# Patient Record
Sex: Female | Born: 1980 | Race: White | Hispanic: No | Marital: Single | Smoking: Former smoker
Health system: Southern US, Community
[De-identification: ages and names within clinical notes are randomized; demographics above are authoritative.]

## PROBLEM LIST (undated history)

## (undated) ENCOUNTER — Ambulatory Visit

## (undated) DIAGNOSIS — K900 Celiac disease: Secondary | ICD-10-CM

## (undated) HISTORY — DX: Celiac disease: K90.0

## (undated) HISTORY — PX: WISDOM TOOTH EXTRACTION: SHX21

---

## 2001-10-06 ENCOUNTER — Emergency Department (HOSPITAL_COMMUNITY): Admission: EM | Admit: 2001-10-06 | Discharge: 2001-10-06 | Payer: Self-pay | Admitting: Emergency Medicine

## 2003-07-23 ENCOUNTER — Other Ambulatory Visit: Admission: RE | Admit: 2003-07-23 | Discharge: 2003-07-23 | Payer: Self-pay | Admitting: Family Medicine

## 2003-09-04 ENCOUNTER — Encounter: Admission: RE | Admit: 2003-09-04 | Discharge: 2003-09-04 | Payer: Self-pay | Admitting: Family Medicine

## 2004-08-13 ENCOUNTER — Other Ambulatory Visit: Admission: RE | Admit: 2004-08-13 | Discharge: 2004-08-13 | Payer: Self-pay | Admitting: Family Medicine

## 2005-08-26 ENCOUNTER — Other Ambulatory Visit: Admission: RE | Admit: 2005-08-26 | Discharge: 2005-08-26 | Payer: Self-pay | Admitting: Family Medicine

## 2005-11-29 ENCOUNTER — Ambulatory Visit: Payer: Self-pay | Admitting: Family Medicine

## 2006-10-13 ENCOUNTER — Other Ambulatory Visit: Admission: RE | Admit: 2006-10-13 | Discharge: 2006-10-13 | Payer: Self-pay | Admitting: Family Medicine

## 2007-10-23 ENCOUNTER — Other Ambulatory Visit: Admission: RE | Admit: 2007-10-23 | Discharge: 2007-10-23 | Payer: Self-pay | Admitting: Family Medicine

## 2010-06-14 ENCOUNTER — Encounter: Payer: Self-pay | Admitting: Family Medicine

## 2011-03-08 ENCOUNTER — Other Ambulatory Visit: Payer: Self-pay | Admitting: Family Medicine

## 2011-03-08 ENCOUNTER — Other Ambulatory Visit (HOSPITAL_COMMUNITY)
Admission: RE | Admit: 2011-03-08 | Discharge: 2011-03-08 | Disposition: A | Discharge status: Home / Self Care | Payer: BC Managed Care – PPO | Source: Ambulatory Visit | Attending: Family Medicine | Admitting: Family Medicine

## 2011-03-08 DIAGNOSIS — Z124 Encounter for screening for malignant neoplasm of cervix: Secondary | ICD-10-CM | POA: Insufficient documentation

## 2011-03-08 DIAGNOSIS — Z1159 Encounter for screening for other viral diseases: Secondary | ICD-10-CM | POA: Insufficient documentation

## 2011-09-15 ENCOUNTER — Other Ambulatory Visit: Payer: Self-pay | Admitting: Dermatology

## 2013-09-12 ENCOUNTER — Other Ambulatory Visit: Payer: Self-pay | Admitting: Dermatology

## 2014-05-31 ENCOUNTER — Other Ambulatory Visit: Payer: Self-pay

## 2014-06-27 ENCOUNTER — Other Ambulatory Visit: Payer: Self-pay

## 2014-07-19 ENCOUNTER — Other Ambulatory Visit: Payer: Self-pay

## 2016-05-27 ENCOUNTER — Encounter: Payer: Self-pay | Admitting: Podiatry

## 2016-05-27 ENCOUNTER — Ambulatory Visit (INDEPENDENT_AMBULATORY_CARE_PROVIDER_SITE_OTHER): Payer: BLUE CROSS/BLUE SHIELD | Admitting: Podiatry

## 2016-05-27 VITALS — BP 119/81 | HR 88 | Resp 16 | Ht 65.0 in | Wt 130.0 lb

## 2016-05-27 DIAGNOSIS — B070 Plantar wart: Secondary | ICD-10-CM | POA: Diagnosis not present

## 2016-05-27 DIAGNOSIS — B351 Tinea unguium: Secondary | ICD-10-CM

## 2016-05-27 NOTE — Progress Notes (Signed)
Subjective:     Patient ID: Deanna Brooks, female   DOB: [birth year], 36 y.o.   MRN: [id number]  HPI patient presents stating that her right big toenail has become yellow and irritated and she damaged at 18 months ago. Also complains of a small lesion plantar aspect right hallux and does have history of wart   Review of Systems  All other systems reviewed and are negative.      Objective:   Physical Exam  Constitutional: She is oriented to person, place, and time.  Cardiovascular: Intact distal pulses.   Musculoskeletal: Normal range of motion.  Neurological: She is oriented to person, place, and time.  Skin: Skin is warm.  Nursing note and vitals reviewed.  neurovascular status was found to be intact muscle strength adequate range of motion within normal limits with patient found to have a thickened right hallux nail that's dystrophic damaged and mildly loose. There is also a keratotic lesion sub-right hallux measuring about 6 mm x 5 that upon debridement shows pinpoint bleeding pain to lateral pressure. Patient's found to have good digital perfusion well oriented 3     Assessment:     Damaged right hallux nail with possible mycotic and foot component and possible verruca plantar aspect right    Plan:     H&P and both conditions discussed. I do think I can work on both the same time and I infiltrated the right hallux 60 Milligan times like Marcaine mixture remove the nail cleaned up the bed and allowing new nail to regrow explaining it may or may not grow normally. I did go ahead and send that off to pathology to try to identify whether there is fungal element and for the plantar right hallux I did carefully curet out the lesion finding it to have a small epidermal dermal change consistent with verruca and I applied phenol to base and sterile dressing. Reappoint to recheck

## 2016-05-27 NOTE — Patient Instructions (Signed)

## 2016-06-24 ENCOUNTER — Encounter: Payer: Self-pay | Admitting: Podiatry

## 2016-06-24 ENCOUNTER — Ambulatory Visit (INDEPENDENT_AMBULATORY_CARE_PROVIDER_SITE_OTHER): Payer: BLUE CROSS/BLUE SHIELD | Admitting: Podiatry

## 2016-06-24 DIAGNOSIS — B351 Tinea unguium: Secondary | ICD-10-CM

## 2016-06-24 DIAGNOSIS — B070 Plantar wart: Secondary | ICD-10-CM | POA: Diagnosis not present

## 2016-06-24 NOTE — Progress Notes (Signed)
Subjective:     Patient ID: Deanna Brooks, female   DOB: [birth year], 36 y.o.   MRN: [id number]  HPI patient presents stating that the nail seems a little bit better and the plantar wart has really improved. Patient states she would like to have this fungus treated if possible and is trying to get pregnant at this time   Review of Systems     Objective:   Physical Exam Neurovascular status intact with positive fungal culture right big toe with crusted nailbeds secondary to removal    Assessment:     Mycotic nail infection right hallux localized with well-healing plantar verruca site    Plan:     Debrided plantar tissue discussed laser treatment and she will have 3 laser treatments done and start topical antifungal. Patient will be seen back to recheck

## 2016-06-28 ENCOUNTER — Ambulatory Visit: Payer: Self-pay | Admitting: Podiatry

## 2016-06-28 DIAGNOSIS — B351 Tinea unguium: Secondary | ICD-10-CM

## 2016-06-28 NOTE — Progress Notes (Signed)
Pt presents with mycotic infection of nails Rt 1st  All other systems are negative  Laser therapy administered to affected nails and tolerated well. All safety precautions were in place. Pt is to continue with essential oil topical and laser therapy. Follow up in 1 month for 2nd of 3 treatments

## 2016-07-26 ENCOUNTER — Ambulatory Visit: Payer: Self-pay | Admitting: Podiatry

## 2016-07-26 DIAGNOSIS — B351 Tinea unguium: Secondary | ICD-10-CM

## 2016-07-26 DIAGNOSIS — B070 Plantar wart: Secondary | ICD-10-CM

## 2016-07-26 NOTE — Progress Notes (Signed)
Subjective:     Patient ID: Deanna Brooks, female   DOB: 1980/07/26, 36 y.o.   MRN: [id number]  HPI patient presents stating that she wanted the lesion looked at and she's happy so far with laser   Review of Systems     Objective:   Physical Exam Neurovascular status intact with laser that seems to be doing well for nailbeds with patient having third treatment today and also small wart left which is improved with previous removal    Assessment:     H&P condition reviewed and at this time I debrided the lesion with no indication or reoccurrence of verruca.    Plan:     Laser applied and we may need to eventually applied more chemical to the left hallux depending on symptoms

## 2016-08-26 ENCOUNTER — Ambulatory Visit: Payer: BLUE CROSS/BLUE SHIELD

## 2016-08-31 DIAGNOSIS — B351 Tinea unguium: Secondary | ICD-10-CM

## 2016-09-01 ENCOUNTER — Ambulatory Visit (INDEPENDENT_AMBULATORY_CARE_PROVIDER_SITE_OTHER): Payer: Self-pay | Admitting: Podiatry

## 2016-09-01 DIAGNOSIS — B351 Tinea unguium: Secondary | ICD-10-CM

## 2016-09-03 NOTE — Progress Notes (Signed)
Pt presents with mycotic infection of nails Rt 1st  All other systems are negative  Laser therapy administered to affected nails and tolerated well. All safety precautions were in place. Pt is to continue with essential oil topical and laser therapy. Follow up as needed

## 2019-05-04 DIAGNOSIS — F4320 Adjustment disorder, unspecified: Secondary | ICD-10-CM | POA: Insufficient documentation

## 2019-12-28 ENCOUNTER — Ambulatory Visit
Admission: EM | Admit: 2019-12-28 | Discharge: 2019-12-28 | Disposition: A | Discharge status: Home / Self Care | Payer: Self-pay | Attending: Emergency Medicine | Admitting: Emergency Medicine

## 2019-12-28 ENCOUNTER — Other Ambulatory Visit: Payer: Self-pay

## 2019-12-28 DIAGNOSIS — H00013 Hordeolum externum right eye, unspecified eyelid: Secondary | ICD-10-CM

## 2019-12-28 DIAGNOSIS — H5711 Ocular pain, right eye: Secondary | ICD-10-CM

## 2019-12-28 MED ORDER — ERYTHROMYCIN 5 MG/GM OP OINT: TOPICAL_OINTMENT | OPHTHALMIC | 0 refills | Status: DC

## 2019-12-28 NOTE — ED Provider Notes (Signed)
[facility]   [id number] 12/28/19 Arrival Time: 0904  CC: FB sensation in RT eye  SUBJECTIVE:  Deanna Brooks is a 39 y.o. female who presents with complaint of RT eye stye x 1 week.  Denies a precipitating event, trauma, or close contacts with similar symptoms.  However, does admit to working outside as well.  Denies alleviating or aggravating factors.  Denies similar symptoms in the past.  Denies fever, chills, nausea, vomiting, eye pain, painful eye movements, itching, vision changes, periorbital erythema.     Denies contact lens use.    ROS: As per HPI.  All other pertinent ROS negative.     History reviewed. No pertinent past medical history. Past Surgical History:  Procedure Laterality Date  . WISDOM TOOTH EXTRACTION     Allergies  Allergen Reactions  . Wheat Bran     Joint aching and swelling   No current facility-administered medications on file prior to encounter.   Current Outpatient Medications on File Prior to Encounter  Medication Sig Dispense Refill  . UNABLE TO FIND Med Name: Nutritional supplements     Social History   Socioeconomic History  . Marital status: Single    Spouse name: Not on file  . Number of children: Not on file  . Years of education: Not on file  . Highest education level: Not on file  Occupational History  . Not on file  Tobacco Use  . Smoking status: Former Games developer  . Smokeless tobacco: Never Used  Substance and Sexual Activity  . Alcohol use: Not Currently  . Drug use: Never  . Sexual activity: Not on file  Other Topics Concern  . Not on file  Social History Narrative  . Not on file   Social Determinants of Health   Financial Resource Strain:   . Difficulty of Paying Living Expenses:   Food Insecurity:   . Worried About Programme researcher, broadcasting/film/video in the Last Year:   . Barista in the Last Year:   Transportation Needs:   . Freight forwarder (Medical):   Marland Kitchen Lack of Transportation (Non-Medical):   Physical  Activity:   . Days of Exercise per Week:   . Minutes of Exercise per Session:   Stress:   . Feeling of Stress :   Social Connections:   . Frequency of Communication with Friends and Family:   . Frequency of Social Gatherings with Friends and Family:   . Attends Religious Services:   . Active Member of Clubs or Organizations:   . Attends Banker Meetings:   Marland Kitchen Marital Status:   Intimate Partner Violence:   . Fear of Current or Ex-Partner:   . Emotionally Abused:   Marland Kitchen Physically Abused:   . Sexually Abused:    History reviewed. No pertinent family history.  OBJECTIVE:  Vitals:   12/28/19 0920  BP: 120/86  Pulse: (!) 115  Resp: 20  Temp: 98.8 F (37.1 C)  SpO2: 98%    General appearance: alert; no distress Eyes: Styes present to upper and lower eyelids with mild swelling and erythema; no conjunctival erythema; PERRL; EOMI without discomfort; clear drainage Neck: supple Lungs: normal respiratory effort Skin: warm and dry Psychological: alert and cooperative; normal mood and affect  ASSESSMENT & PLAN:  1. Hordeolum externum of right eye, unspecified eyelid   2. Eye discomfort, right     Meds ordered this encounter  Medications  . erythromycin ophthalmic ointment    Sig: Place a 1/2  inch ribbon of ointment into the upper and lower eyelid.    Dispense:  3.5 g    Refill:  0    Order Specific Question:   Supervising Provider    Answer:   Eustace Moore [[id number]]    Perform warm compresses at home.  Soak a wash cloth in warm (not scalding) water and place it over the eyes. As the wash cloth cools, it should be rewarmed and replaced for a total of 5 to 10 minutes of soaking time. Warm compresses should be applied two to four times a day as long as the patient has symptoms Perform lid washing: Either warm water or very dilute baby shampoo can be placed on a clean wash cloth, gauze pad, or cotton swab. Then be advised to gently clean along the lashes and lid  margin to remove the accumulated material with care to avoid contacting the ocular surface. If shampoo is used, thorough rinsing is recommended. Vigorous washing should be avoided, as it may cause more irritation.  Prescribed erythromycin ointment.  Apply up to 6 times daily for 5-7 days, or until symptomatic improvement Follow up with ophthalmology for further evaluation and management if symptoms persists Return or go to ER if you have any new or worsening symptoms such as fever, chills, redness, swelling, eye pain, painful eye movements, vision changes, etc...  Reviewed expectations re: course of current medical issues. Questions answered. Outlined signs and symptoms indicating need for more acute intervention. Patient verbalized understanding. After Visit Summary given.   Alvino Chapel Seadrift, PA-C 12/28/19 [phone]

## 2019-12-28 NOTE — ED Triage Notes (Signed)
Pt presents with right eye irritation," feel like something is in it". Some swelling to lids

## 2019-12-28 NOTE — Discharge Instructions (Signed)
Perform warm compresses at home.  Soak a wash cloth in warm (not scalding) water and place it over the eyes. As the wash cloth cools, it should be rewarmed and replaced for a total of 5 to 10 minutes of soaking time. Warm compresses should be applied two to four times a day as long as the patient has symptoms Perform lid washing: Either warm water or very dilute baby shampoo can be placed on a clean wash cloth, gauze pad, or cotton swab. Then be advised to gently clean along the lashes and lid margin to remove the accumulated material with care to avoid contacting the ocular surface. If shampoo is used, thorough rinsing is recommended. Vigorous washing should be avoided, as it may cause more irritation.  Prescribed erythromycin ointment.  Apply up to 6 times daily for 5-7 days, or until symptomatic improvement Follow up with ophthalmology for further evaluation and management if symptoms persists Return or go to ER if you have any new or worsening symptoms such as fever, chills, redness, swelling, eye pain, painful eye movements, vision changes, etc... 

## 2020-08-28 ENCOUNTER — Other Ambulatory Visit: Payer: Self-pay

## 2020-08-28 ENCOUNTER — Emergency Department (HOSPITAL_COMMUNITY): Payer: BC Managed Care – PPO

## 2020-08-28 ENCOUNTER — Emergency Department (HOSPITAL_COMMUNITY)
Admission: EM | Admit: 2020-08-28 | Discharge: 2020-08-28 | Disposition: A | Discharge status: Home / Self Care | Payer: BC Managed Care – PPO | Attending: Emergency Medicine | Admitting: Emergency Medicine

## 2020-08-28 DIAGNOSIS — R1011 Right upper quadrant pain: Secondary | ICD-10-CM

## 2020-08-28 DIAGNOSIS — R1012 Left upper quadrant pain: Secondary | ICD-10-CM | POA: Diagnosis not present

## 2020-08-28 DIAGNOSIS — Z87891 Personal history of nicotine dependence: Secondary | ICD-10-CM | POA: Insufficient documentation

## 2020-08-28 LAB — TROPONIN I (HIGH SENSITIVITY)
Troponin I (High Sensitivity): 2 ng/L (ref <18)
Troponin I (High Sensitivity): 2 ng/L (ref <18)

## 2020-08-28 LAB — COMPREHENSIVE METABOLIC PANEL
ALT: 13 U/L (ref 0–44)
AST: 21 U/L (ref 15–41)
Albumin: 4.4 g/dL (ref 3.5–5.0)
Alkaline Phosphatase: 32 U/L — ABNORMAL LOW (ref 38–126)
Anion gap: 7 (ref 5–15)
BUN: 11 mg/dL (ref 6–20)
CO2: 25 mmol/L (ref 22–32)
Calcium: 9.1 mg/dL (ref 8.9–10.3)
Chloride: 108 mmol/L (ref 98–111)
Creatinine, Ser: 0.7 mg/dL (ref 0.44–1.00)
GFR, Estimated: >60 mL/min (ref >60)
Glucose, Bld: 90 mg/dL (ref 70–99)
Potassium: 3.4 mmol/L — ABNORMAL LOW (ref 3.5–5.1)
Sodium: 140 mmol/L (ref 135–145)
Total Bilirubin: 0.8 mg/dL (ref 0.3–1.2)
Total Protein: 6.9 g/dL (ref 6.5–8.1)

## 2020-08-28 LAB — CBC
HCT: 37.4 % (ref 36.0–46.0)
Hemoglobin: 12.8 g/dL (ref 12.0–15.0)
MCH: 32.3 pg (ref 26.0–34.0)
MCHC: 34.2 g/dL (ref 30.0–36.0)
MCV: 94.4 fL (ref 80.0–100.0)
Platelets: 308 10*3/uL (ref 150–400)
RBC: 3.96 MIL/uL (ref 3.87–5.11)
RDW: 12.3 % (ref 11.5–15.5)
WBC: 7.5 10*3/uL (ref 4.0–10.5)
nRBC: 0 % (ref 0.0–0.2)

## 2020-08-28 LAB — I-STAT BETA HCG BLOOD, ED (MC, WL, AP ONLY): I-stat hCG, quantitative: <5 m[IU]/mL (ref <5)

## 2020-08-28 LAB — LIPASE, BLOOD: Lipase: 49 U/L (ref 11–51)

## 2020-08-28 MED ORDER — OMEPRAZOLE 20 MG PO CPDR: 20.0000 mg | DELAYED_RELEASE_CAPSULE | Freq: Every day | ORAL | 0 refills | Status: DC

## 2020-08-28 MED ORDER — SUCRALFATE 1 G PO TABS: 1.0000 g | ORAL_TABLET | Freq: Three times a day (TID) | ORAL | 0 refills | Status: DC

## 2020-08-28 MED ORDER — FAMOTIDINE IN NACL 20-0.9 MG/50ML-% IV SOLN
20.0000 mg | Freq: Once | INTRAVENOUS | Status: AC
  Administered 2020-08-28: 20 mg via INTRAVENOUS
  Filled 2020-08-28: qty 50

## 2020-08-28 MED ORDER — SUCRALFATE 1 G PO TABS: 1.0000 g | ORAL_TABLET | Freq: Three times a day (TID) | ORAL | 0 refills | Status: AC

## 2020-08-28 MED ORDER — IOHEXOL 300 MG/ML  SOLN
100.0000 mL | Freq: Once | INTRAMUSCULAR | Status: AC | PRN
  Administered 2020-08-28: 100 mL via INTRAVENOUS

## 2020-08-28 NOTE — ED Provider Notes (Signed)
MSE was initiated and I personally evaluated the patient and placed orders (if any) at  5:41 PM on August 28, 2020.  The patient appears stable so that the remainder of the MSE may be completed by another provider.  Vitals:   08/28/20 1733  BP: (!) 129/92  Pulse: 88  Resp: 16  Temp: 98.3 F (36.8 C)  SpO2: 100%      Carroll Sage, PA-C 08/28/20 1742    Sabas Sous, MD 08/29/20 [phone]

## 2020-08-28 NOTE — ED Provider Notes (Signed)
[facility] EMERGENCY DEPARTMENT Provider Note   CSN: [id number] Arrival date & time: 08/28/20  1726     History Chief Complaint  Patient presents with  . Abdominal Pain  . Chest Pain    Deanna Brooks is a 40 y.o. female.  HPI  Patient is a 40 YO female with a history of GERD who presents with abdominal pain. Patient endorsing LUQ abdominal pain which is sharp and radiating to her back intermittently. She endorsed a history of hiatal hernias that caused similar severe heartburn in the past. Patient denies any fevers or chills, n/v, syncope or SOB. Patient has not taken anything for her symptoms at this time.  No past medical history on file.  There are no problems to display for this patient.   Past Surgical History:  Procedure Laterality Date  . WISDOM TOOTH EXTRACTION       OB History   No obstetric history on file.     No family history on file.  Social History   Tobacco Use  . Smoking status: Former Games developer  . Smokeless tobacco: Never Used  Substance Use Topics  . Alcohol use: Not Currently  . Drug use: Never    Home Medications Prior to Admission medications   Medication Sig Start Date End Date Taking? Authorizing Provider  erythromycin ophthalmic ointment Place a 1/2 inch ribbon of ointment into the upper and lower eyelid. 12/28/19   Wurst, [place], PA-C  UNABLE TO FIND Med Name: Nutritional supplements    [provider]    Allergies    Gluten meal and Wheat bran  Review of Systems   Review of Systems  Constitutional: Negative for chills and fever.  HENT: Negative for ear pain and sore throat.   Eyes: Negative for pain and visual disturbance.  Respiratory: Negative for cough and shortness of breath.   Cardiovascular: Negative for chest pain and palpitations.  Gastrointestinal: Positive for abdominal pain. Negative for anal bleeding, blood in stool and vomiting.  Genitourinary: Negative for dysuria and hematuria.   Musculoskeletal: Negative for arthralgias and back pain.  Skin: Negative for color change and rash.  Neurological: Negative for seizures and syncope.  All other systems reviewed and are negative.   Physical Exam Updated Vital Signs BP (!) 129/92   Pulse 88   Temp 98.3 F (36.8 C) (Oral)   Resp 16   LMP 08/23/2020 (Exact Date)   SpO2 100%   Physical Exam Vitals and nursing note reviewed.  Constitutional:      General: She is not in acute distress.    Appearance: She is well-developed.  HENT:     Head: Normocephalic and atraumatic.  Eyes:     Conjunctiva/sclera: Conjunctivae normal.  Cardiovascular:     Rate and Rhythm: Normal rate and regular rhythm.     Heart sounds: No murmur heard.   Pulmonary:     Effort: Pulmonary effort is normal. No respiratory distress.     Breath sounds: Normal breath sounds.  Abdominal:     Palpations: Abdomen is soft.     Tenderness: There is abdominal tenderness in the epigastric area. There is no guarding. Negative signs include Murphy's sign.  Musculoskeletal:     Cervical back: Neck supple.  Skin:    General: Skin is warm and dry.  Neurological:     Mental Status: She is alert.     ED Results / Procedures / Treatments   Labs (all labs ordered are listed, but only abnormal results are  displayed) Labs Reviewed  COMPREHENSIVE METABOLIC PANEL - Abnormal; Notable for the following components:      Result Value   Potassium 3.4 (*)    Alkaline Phosphatase 32 (*)    All other components within normal limits  CBC  LIPASE, BLOOD  I-STAT BETA HCG BLOOD, ED ([facility], [facility], [facility] ONLY)  TROPONIN I (HIGH SENSITIVITY)  TROPONIN I (HIGH SENSITIVITY)    EKG None  Radiology DG Chest 2 View  Result Date: 08/28/2020 CLINICAL DATA:  Left-sided chest pain. EXAM: CHEST - 2 VIEW COMPARISON:  None. FINDINGS: The heart size and mediastinal contours are within normal limits. Both lungs are clear. The visualized skeletal structures are unremarkable.  IMPRESSION: No active cardiopulmonary disease. Electronically Signed   By: Aram Candela M.D.   On: 08/28/2020 18:03     ED Course  I have reviewed the triage vital signs and the nursing notes.  Pertinent labs & imaging results that were available during my care of the patient were reviewed by me and considered in my medical decision making (see chart for details).    MDM Rules/Calculators/A&P                          Patient is an otherwise healthy 26 YOF with a chief complaint of abdominal pain. On exam, patient is in no acute distress but exam with LUQ to back pain. Patient's HPI and PE findings are most consistent with peptic ulcer disease vs pancreatitis. However, given severity of symptoms, will proceed with CT abdomen pelvis to evaluate for alternative etiologies. CT resulted with no acute abnormality other than non specific gastric findings. Informed patient of the findings and importance of GI follow up and patient expressed understanding. Started patient on omeprazole QD until follow up.   Disposition: Based on the above findings, I believe patient is stable for discharge.   Patient/family educated about specific return precautions for given chief complaint and symptoms.  Patient/family educated about follow-up with PCP and GI.  Patient/family expressed understanding of return precautions and need for follow-up. Patient spoken to regarding all imaging and laboratory results and appropriate follow up for these results. All education provided in verbal form with additional information in written form. Time was allowed for answering of patient questions. Patient discharged.   Emergency Department Medication Summary: Medications  famotidine (PEPCID) IVPB 20 mg premix (0 mg Intravenous Stopped 08/28/20 2158)  iohexol (OMNIPAQUE) 300 MG/ML solution 100 mL (100 mLs Intravenous Contrast Given 08/28/20 2147)    Final Clinical Impression(s) / ED Diagnoses Final diagnoses:  None    Rx /  DC Orders ED Discharge Orders    None       Glyn Ade, MD 08/29/20 Simeon Craft, MD 08/30/20 [phone]

## 2020-08-28 NOTE — Discharge Instructions (Addendum)
You were evaluated for epigastric pain today. Your evaluation without any obvious etiology for your symptoms. Your story raises concern for peptic ulcer disease vs other abnormality of the stomach. The CT results mention malignancy which while unlikely does warrant further workup. Please start omeprazole daily.  Thank you for the ability to participate in your care.

## 2020-08-28 NOTE — ED Triage Notes (Signed)
Pt with upper abdominal/central chest pain since yesterday morning, also woke up with severe headache at that time. Pain relieved by eating. Endorses nausea without vomiting.

## 2020-09-03 ENCOUNTER — Encounter: Payer: Self-pay | Admitting: Nurse Practitioner

## 2020-09-03 ENCOUNTER — Ambulatory Visit (INDEPENDENT_AMBULATORY_CARE_PROVIDER_SITE_OTHER): Payer: BC Managed Care – PPO | Admitting: Nurse Practitioner

## 2020-09-03 VITALS — BP 118/80 | HR 76 | Ht 65.0 in | Wt 134.8 lb

## 2020-09-03 DIAGNOSIS — R9389 Abnormal findings on diagnostic imaging of other specified body structures: Secondary | ICD-10-CM | POA: Diagnosis not present

## 2020-09-03 DIAGNOSIS — R1013 Epigastric pain: Secondary | ICD-10-CM

## 2020-09-03 DIAGNOSIS — F419 Anxiety disorder, unspecified: Secondary | ICD-10-CM | POA: Insufficient documentation

## 2020-09-03 NOTE — Patient Instructions (Signed)
If you are age 39 or older, your body mass index should be between 23-30. Your Body mass index is 22.43 kg/m. If this is out of the aforementioned range listed, please consider follow up with your Primary Care Provider.  If you are age 6 or younger, your body mass index should be between 19-25. Your Body mass index is 22.43 kg/m. If this is out of the aformentioned range listed, please consider follow up with your Primary Care Provider.   You have been scheduled for an endoscopy. Please follow written instructions given to you at your visit today. If you use inhalers (even only as needed), please bring them with you on the day of your procedure.  Follow up pending the results of your Endoscopy or as needed.  Thank you for entrusting me with your care and choosing [facility].  Willette Cluster, NP-C

## 2020-09-03 NOTE — Progress Notes (Signed)
I agree with the above note, plan 

## 2020-09-03 NOTE — Progress Notes (Signed)
ASSESSMENT AND PLAN    # 40 year old female with epigastric pain radiating through to back.  CT scan done at recent ED visit suggests gastric/antral wall thickening .  Labs unremarkable. Pain improving with Carafate and PPI --Patient was scheduled for an EGD . The risks and benefits of EGD were discussed and the patient agrees to proceed.  Patient is new to the practice.  Dr. Meridee Score has no openings for upper endoscopy until June.  I have scheduled her to have the EGD with Dr. Christella Hartigan --No NSAID until time of EGD --Continue Carafate as needed --Continue BID Omeprazole --Can use Miralax 1 capful daily in 8 oz of water as needed if Carafate causes constipation   HISTORY OF PRESENT ILLNESS     Chief Complaint : Upper abdominal pain, recently seen in the ED  Deanna Brooks is a 40 y.o. female new to the practice with a past medical history significant for GERD  Patient is here for evaluation of RUQ pain.  She was seen in the ED on 08/28/2020.  Review of those records states she was having LUQ pain radiating into her back.  In the ED she was slightly hypokalemic. Lipase and liver chemistries were normal.  CT scan suggested thickening of the gastric body/antral wall.   Patient awoke from sleep on 4/6 with severe burning epigastric pain radiating through to her back. She drank some Vinegar which generally helps her heartburn. Pain improved but returned around lunch time. No associated nausea / vomiting. She had taken 2 Aleve earlier in the day for a headache but doesn't routinely take NSAIDs.  No blood in stool/black stools . No history of PUD.  ED prescribed twice daily PPI and Carafate and she is feeling better. Her weight has been stable. BM have slowed down some since starting Carafate.  Patient has had intermittent heartburn for years but doesn't take anything for it.   Data Reviewed: 08/28/2020 CT scan abdomen and pelvis with contrast IMPRESSION: Thickened and low-attenuation gastric  body/antral wall. Finding could represent malignancy versus gastritis. Recommend direct visualization for further evaluation.   PMH: GERD  FMH: Celiac disease and diabetes  Past Surgical History:  Procedure Laterality Date  . WISDOM TOOTH EXTRACTION      Social History   Tobacco Use  . Smoking status: Former Games developer  . Smokeless tobacco: Never Used  Substance Use Topics  . Alcohol use: Not Currently  . Drug use: Never   Current Outpatient Medications  Medication Sig Dispense Refill  . b complex vitamins capsule Take 1 capsule by mouth daily.    . busPIRone (BUSPAR) 5 MG tablet Take 1 tablet by mouth as needed.    . cholecalciferol (VITAMIN D) 25 MCG (1000 UNIT) tablet Take 3 tablets by mouth daily.    Marland Kitchen omeprazole (PRILOSEC) 20 MG capsule Take 20 mg by mouth 2 (two) times daily before a meal.    . sucralfate (CARAFATE) 1 g tablet Take 1 tablet (1 g total) by mouth 4 (four) times daily -  with meals and at bedtime. 90 tablet 0  . vitamin C (ASCORBIC ACID) 500 MG tablet Take 1,000 mg by mouth daily.     No current facility-administered medications for this visit.   Allergies  Allergen Reactions  . Gluten Meal Swelling    Other reaction(s): Sweating (intolerance) Swelling/pain in joints    . Wheat Bran     Joint aching and swelling     Review of Systems:  All other systems  reviewed and negative except where noted in HPI.   PHYSICAL EXAM :    Wt Readings from Last 3 Encounters:  09/03/20 134 lb 12.8 oz (61.1 kg)  05/27/16 130 lb (59 kg)    BP 118/80   Pulse 76   Ht 5\' 5"  (1.651 m)   Wt 134 lb 12.8 oz (61.1 kg)   LMP 08/23/2020 (Exact Date)   BMI 22.43 kg/m  Constitutional:  Pleasant female in no acute distress. Psychiatric: Normal mood and affect. Behavior is normal. EENT: Pupils normal.  Conjunctivae are normal. No scleral icterus. Neck supple.  Cardiovascular: Normal rate, regular rhythm. No edema Pulmonary/chest: Effort normal and breath sounds  normal. No wheezing, rales or rhonchi. Abdominal: Soft, nondistended, nontender. Bowel sounds active throughout. There are no masses palpable. No hepatomegaly. Neurological: Alert and oriented to person place and time. Skin: Skin is warm and dry. No rashes noted.  10/23/2020, NP  09/03/2020, 3:47 PM

## 2020-09-08 ENCOUNTER — Encounter: Payer: Self-pay | Admitting: Gastroenterology

## 2020-09-08 ENCOUNTER — Other Ambulatory Visit: Payer: Self-pay

## 2020-09-08 ENCOUNTER — Ambulatory Visit (AMBULATORY_SURGERY_CENTER): Payer: BC Managed Care – PPO | Admitting: Gastroenterology

## 2020-09-08 VITALS — BP 106/69 | HR 71 | Temp 98.6°F | Resp 12 | Ht 65.0 in | Wt 134.0 lb

## 2020-09-08 DIAGNOSIS — K259 Gastric ulcer, unspecified as acute or chronic, without hemorrhage or perforation: Secondary | ICD-10-CM | POA: Diagnosis present

## 2020-09-08 DIAGNOSIS — R9389 Abnormal findings on diagnostic imaging of other specified body structures: Secondary | ICD-10-CM

## 2020-09-08 DIAGNOSIS — K2970 Gastritis, unspecified, without bleeding: Secondary | ICD-10-CM | POA: Diagnosis not present

## 2020-09-08 DIAGNOSIS — R1013 Epigastric pain: Secondary | ICD-10-CM

## 2020-09-08 DIAGNOSIS — K2950 Unspecified chronic gastritis without bleeding: Secondary | ICD-10-CM

## 2020-09-08 MED ORDER — SODIUM CHLORIDE 0.9 % IV SOLN: 500.0000 mL | INTRAVENOUS | Status: DC

## 2020-09-08 MED ORDER — OMEPRAZOLE 40 MG PO CPDR: 40.0000 mg | DELAYED_RELEASE_CAPSULE | Freq: Two times a day (BID) | ORAL | 3 refills | Status: DC

## 2020-09-08 NOTE — Progress Notes (Signed)
pt tolerated well. VSS. awake and to recovery. Report given to RN. Bite block left insitu to s/s trauma.

## 2020-09-08 NOTE — Progress Notes (Signed)
Pt's states no medical or surgical changes since previsit or office visit. 

## 2020-09-08 NOTE — Op Note (Signed)
[facility] Patient Name: Deanna Brooks Procedure Date: 09/08/2020 2:20 PM MRN: [id number] Endoscopist: Rachael Fee , MD Age: 40 Referring MD:  Date of Birth: Dec 01, 1980 Gender: Female Account #: [id number] Procedure:                Upper GI endoscopy Indications:              Epigastric abdominal pain, abnormal CT scan of                            stomach Medicines:                Monitored Anesthesia Care Procedure:                Pre-Anesthesia Assessment:                           - Prior to the procedure, a History and Physical                            was performed, and patient medications and                            allergies were reviewed. The patient's tolerance of                            previous anesthesia was also reviewed. The risks                            and benefits of the procedure and the sedation                            options and risks were discussed with the patient.                            All questions were answered, and informed consent                            was obtained. Prior Anticoagulants: The patient has                            taken no previous anticoagulant or antiplatelet                            agents. ASA Grade Assessment: II - A patient with                            mild systemic disease. After reviewing the risks                            and benefits, the patient was deemed in                            satisfactory condition to undergo the procedure.  After obtaining informed consent, the endoscope was                            passed under direct vision. Throughout the                            procedure, the patient's blood pressure, pulse, and                            oxygen saturations were monitored continuously. The                            Endoscope was introduced through the mouth, and                            advanced to the second part of duodenum. The upper                             GI endoscopy was accomplished without difficulty.                            The patient tolerated the procedure well. Scope In: Scope Out: Findings:                 One cratered gastric ulcer with no stigmata of                            bleeding was found in the distal gastric body                            (posterior wall apparently). The ulcer crater was                            clean based, 16mm across with heaped up, edematous                            surrounding mucosa.                           The exam was otherwise without abnormality. Complications:            No immediate complications. Estimated blood loss:                            None. Estimated Blood Loss:     Estimated blood loss: none. Impression:               - One cratered gastric ulcer with no stigmata of                            bleeding was found in the distal gastric body                            (posterior wall apparently). The ulcer crater was  clean based, 77mm across with heaped up, edematous                            surrounding mucosa.                           - The examination was otherwise normal. Recommendation:           - Patient has a contact number available for                            emergencies. The signs and symptoms of potential                            delayed complications were discussed with the                            patient. Return to normal activities tomorrow.                            Written discharge instructions were provided to the                            patient.                           - Resume previous diet.                           - Please resume your omeprazole 40mg  pills, one                            pill twice daily for now. No need to resume the                            carafate.                           - Await pathology results. , MD 09/08/2020 2:37:41 PM This report  has been signed electronically.

## 2020-09-08 NOTE — Patient Instructions (Signed)
Please resume your omeprazole 40mg  two times daily for now.  You may discontinue carafate Resume previous diet Await pathology results YOU HAD AN ENDOSCOPIC PROCEDURE TODAY AT THE [facility]:   Refer to the procedure report that was given to you for any specific questions about what was found during the examination.  If the procedure report does not answer your questions, please call your gastroenterologist to clarify.  If you requested that your care partner not be given the details of your procedure findings, then the procedure report has been included in a sealed envelope for you to review at your convenience later.  YOU SHOULD EXPECT: Some feelings of bloating in the abdomen. Passage of more gas than usual.  Walking can help get rid of the air that was put into your GI tract during the procedure and reduce the bloating. If you had a lower endoscopy (such as a colonoscopy or flexible sigmoidoscopy) you may notice spotting of blood in your stool or on the toilet paper. If you underwent a bowel prep for your procedure, you may not have a normal bowel movement for a few days.  Please Note:  You might notice some irritation and congestion in your nose or some drainage.  This is from the oxygen used during your procedure.  There is no need for concern and it should clear up in a day or so.  SYMPTOMS TO REPORT IMMEDIATELY:   ollowing upper endoscopy (EGD)  Vomiting of blood or coffee ground material  New chest pain or pain under the shoulder blades  Painful or persistently difficult swallowing  New shortness of breath  Fever of 100F or higher  Black, tarry-looking stools  For urgent or emergent issues, a gastroenterologist can be reached at any hour by calling [phone]. Do not use MyChart messaging for urgent concerns.   DIET:  We do recommend a small meal at first, but then you may proceed to your regular diet.  Drink plenty of fluids but you should avoid alcoholic  beverages for 24 hours.  ACTIVITY:  You should plan to take it easy for the rest of today and you should NOT DRIVE or use heavy machinery until tomorrow (because of the sedation medicines used during the test).    FOLLOW UP: Our staff will call the number listed on your records 48-72 hours following your procedure to check on you and address any questions or concerns that you may have regarding the information given to you following your procedure. If we do not reach you, we will leave a message.  We will attempt to reach you two times.  During this call, we will ask if you have developed any symptoms of COVID 19. If you develop any symptoms (ie: fever, flu-like symptoms, shortness of breath, cough etc.) before then, please call [phone].  If you test positive for Covid 19 in the 2 weeks post procedure, please call and report this information to [phone].    If any biopsies were taken you will be contacted by phone or by letter within the next 1-3 weeks.  Please call us at [phone] if you have not heard about the biopsies in 3 weeks.   SIGNATURES/CONFIDENTIALITY: You and/or your care partner have signed paperwork which will be entered into your electronic medical record.  These signatures attest to the fact that that the information above on your After Visit Summary has been reviewed and is understood.  Full responsibility of the confidentiality of this discharge information lies  with you and/or your care-partner.

## 2020-09-08 NOTE — Progress Notes (Signed)
Called to room to assist during endoscopic procedure.  Patient ID and intended procedure confirmed with present staff. Received instructions for my participation in the procedure from the performing physician.  

## 2020-09-10 ENCOUNTER — Telehealth: Payer: Self-pay

## 2020-09-10 NOTE — Telephone Encounter (Signed)
No answer, left message to call back later today, B.Arrington Yohe RN. 

## 2020-09-10 NOTE — Telephone Encounter (Signed)
  Follow up Call-  Call back number 09/08/2020  Post procedure Call Back phone  # [phone]  Permission to leave phone message Yes  Some recent data might be hidden     Patient questions:  Do you have a fever, pain , or abdominal swelling? No. Pain Score  0 *  Have you tolerated food without any problems? Yes.    Have you been able to return to your normal activities? Yes.    Do you have any questions about your discharge instructions: Diet   No. Medications  No. Follow up visit  No.  Do you have questions or concerns about your Care? No.  Actions: * If pain score is 4 or above: No action needed, pain <4.  1. Have you developed a fever since your procedure? no  2.   Have you had an respiratory symptoms (SOB or cough) since your procedure? no  3.   Have you tested positive for COVID 19 since your procedure no  4.   Have you had any family members/close contacts diagnosed with the COVID 19 since your procedure?  no   If yes to any of these questions please route to Laverna Peace, RN and Karlton Lemon, RN

## 2020-11-01 ENCOUNTER — Other Ambulatory Visit: Payer: Self-pay | Admitting: Gastroenterology

## 2022-11-22 IMAGING — DX DG CHEST 2V
2 series · 2 of 2 positions shown · non-contrast
Comparison: None.

CLINICAL DATA: Left-sided chest pain.

EXAM:
CHEST - 2 VIEW

[chest pa]
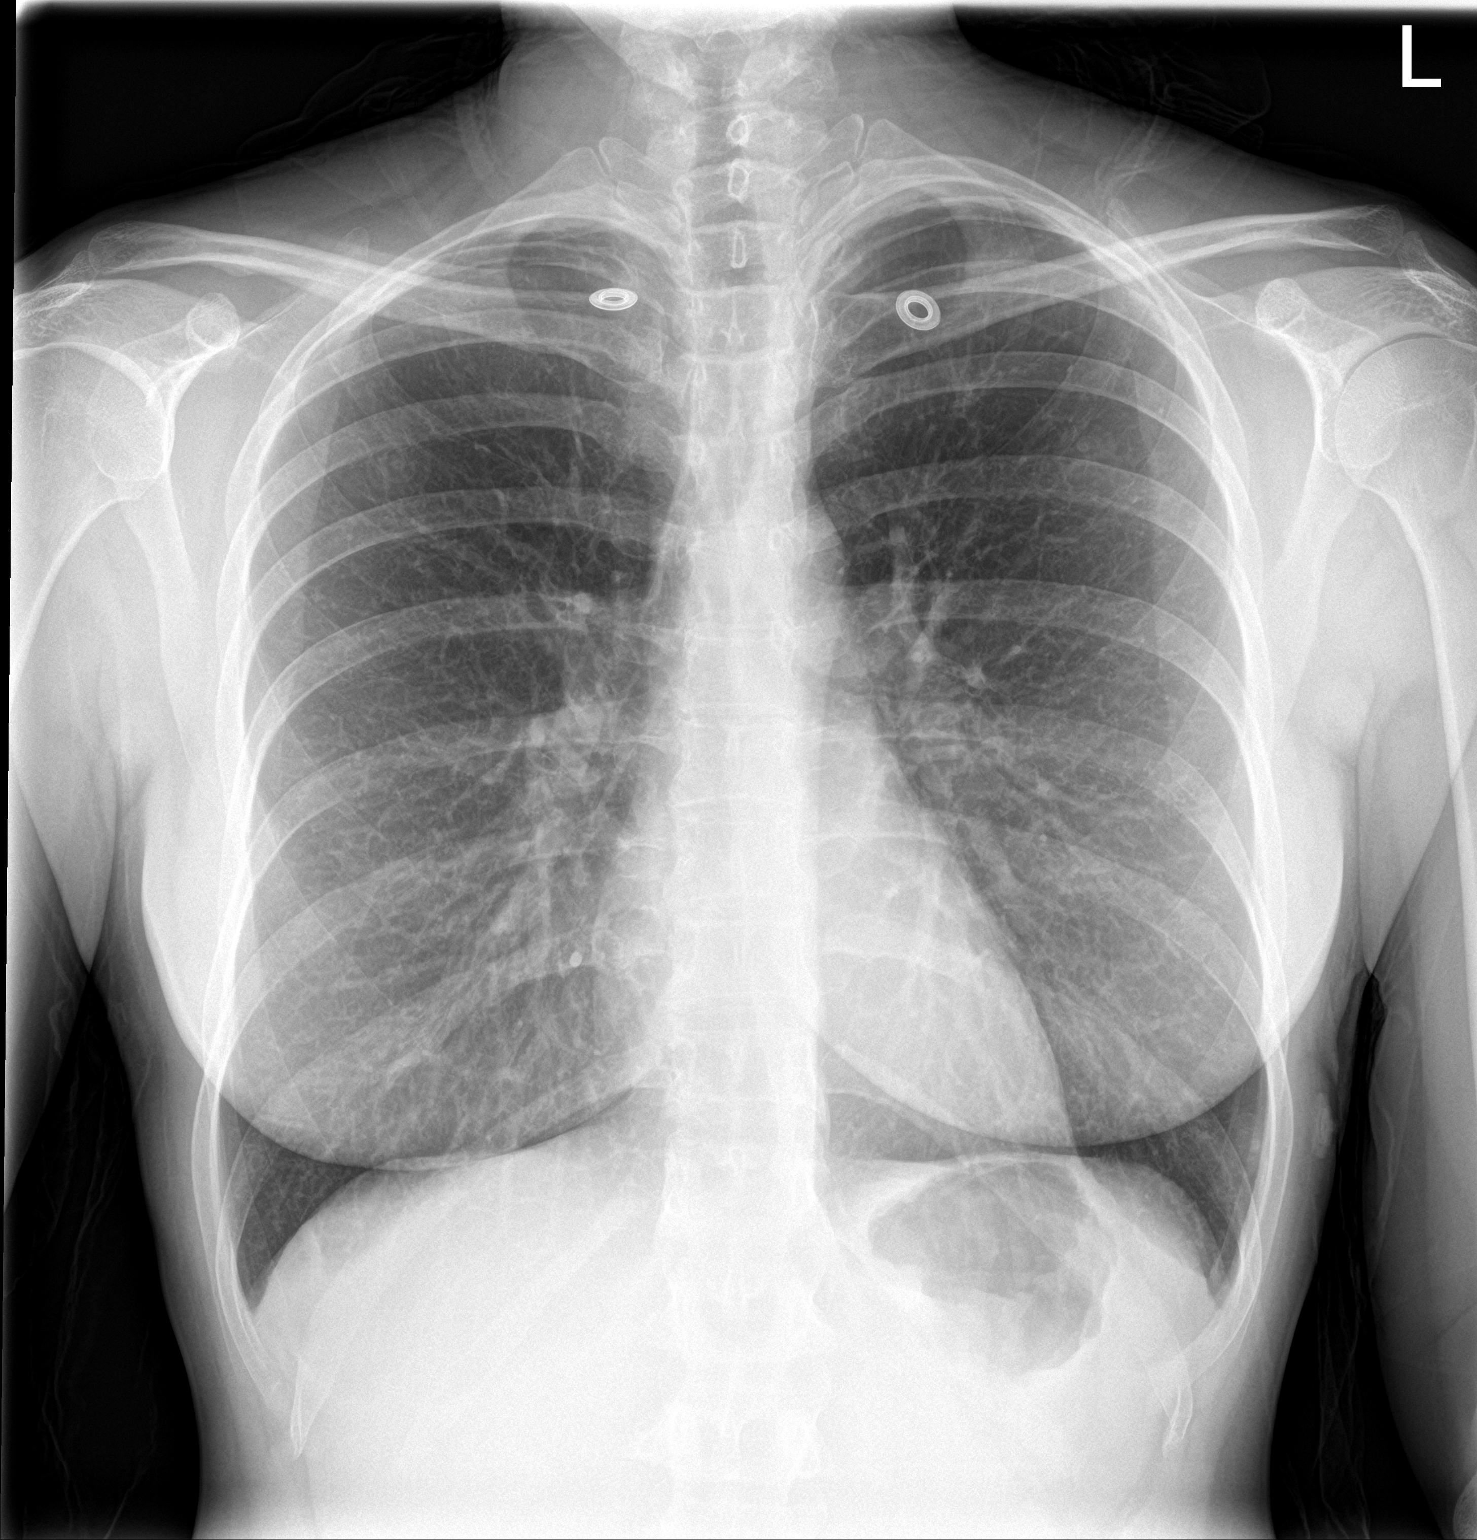

[chest lat]
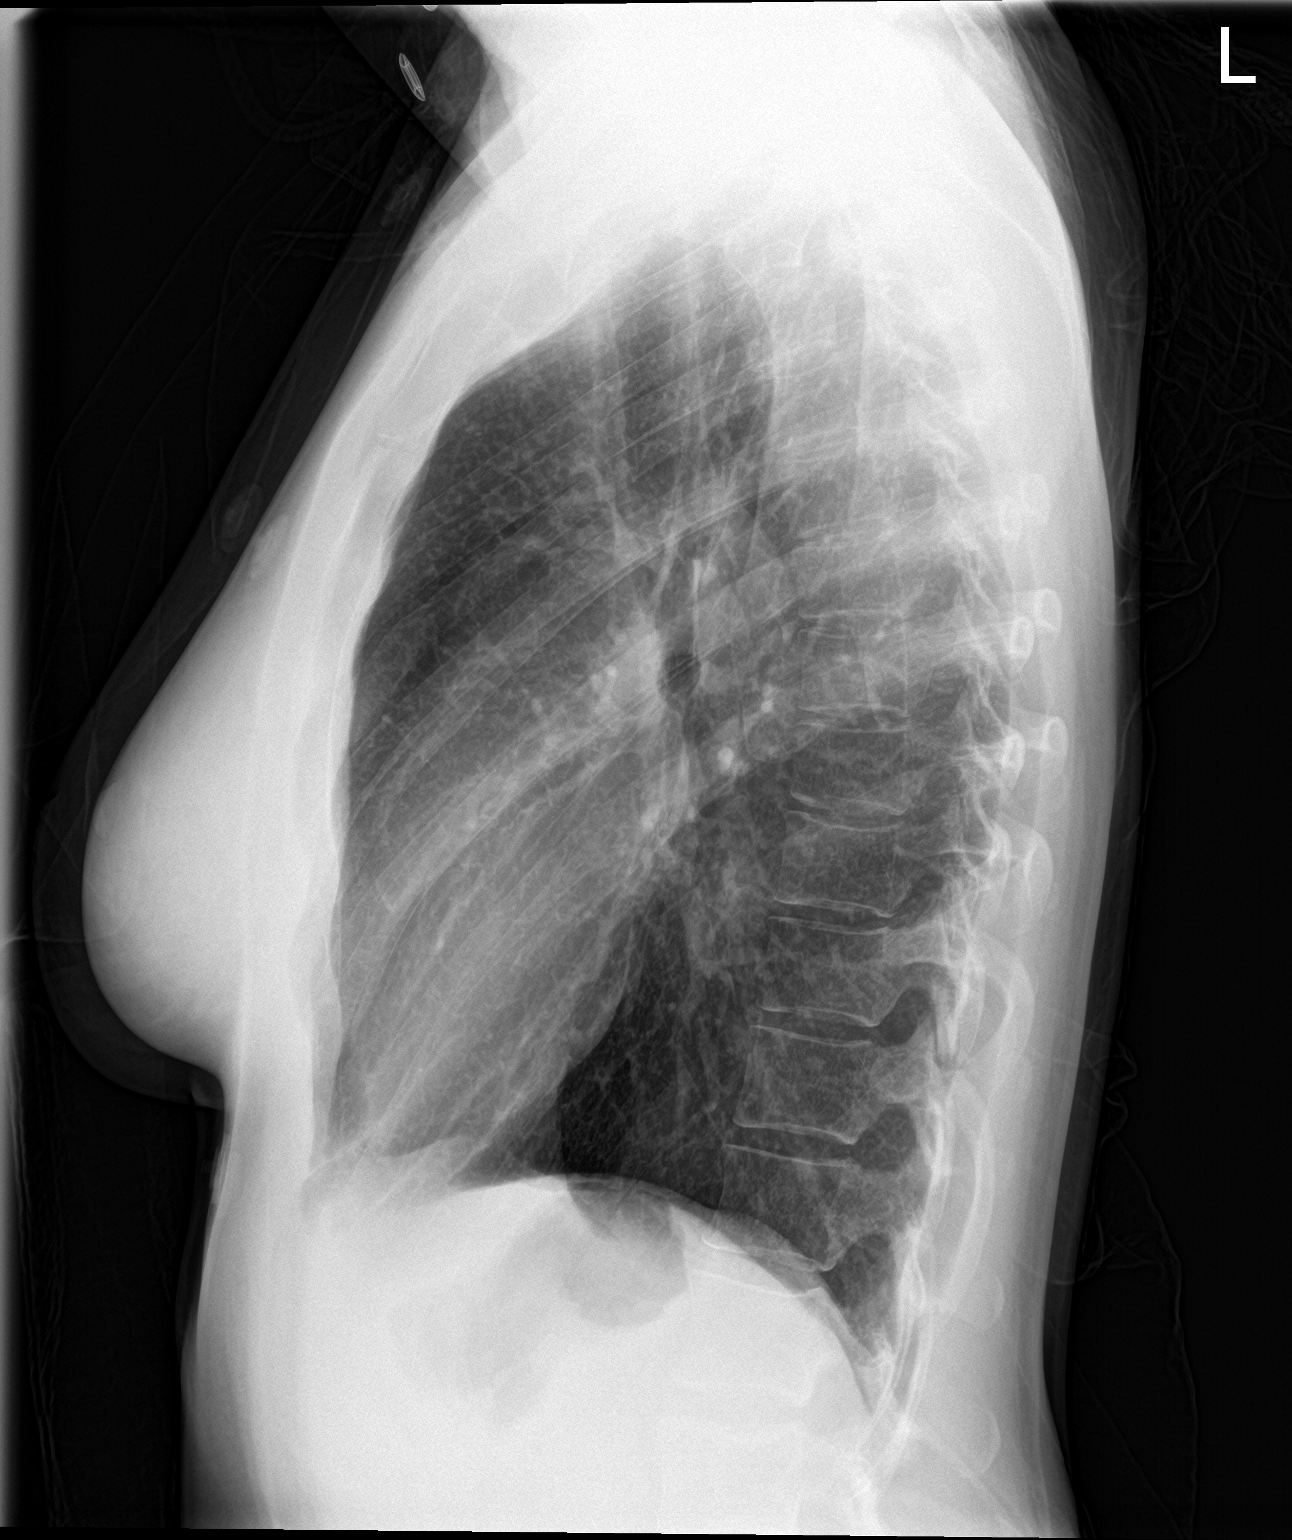

[2 of 2 positions shown; findings below may reference images not displayed]

FINDINGS: The heart size and mediastinal contours are within normal limits.
Both lungs are clear. The visualized skeletal structures are
unremarkable.
IMPRESSION: No active cardiopulmonary disease.

## 2022-11-22 IMAGING — CT CT ABD-PELV W/ CM
2 of 5 series · 16 of 46 positions shown, 18 images · IV contrast (Omni 300)
Comparison: None.

CLINICAL DATA: Nonlocalized acute abdominal pain.

EXAM:
CT ABDOMEN AND PELVIS WITH CONTRAST
TECHNIQUE: Multidetector CT imaging of the abdomen and pelvis was performed
using the standard protocol following bolus administration of
intravenous contrast.
CONTRAST:  100mL OMNIPAQUE IOHEXOL 300 MG/ML  SOLN

[Series 3: a/p w/ 5mm · axial · 0.76mm/px · z∈[-672,-257]mm · 13 of 95 slices shown, 15 images]
[im 6/95  soft-tissue]
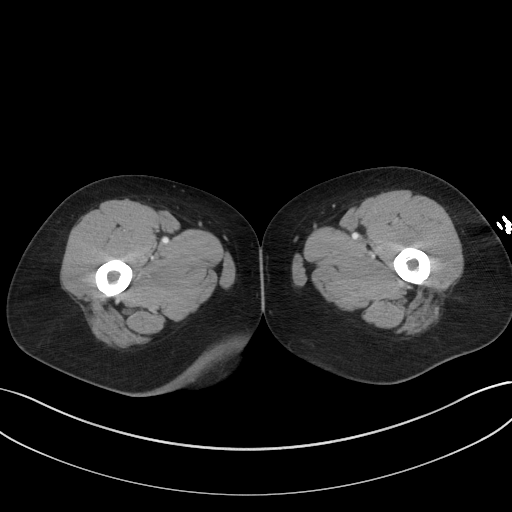
[im 6/95  bone]
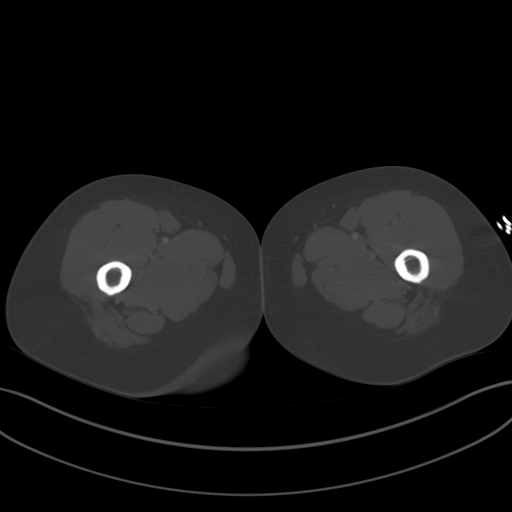
[im 12/95  soft-tissue]
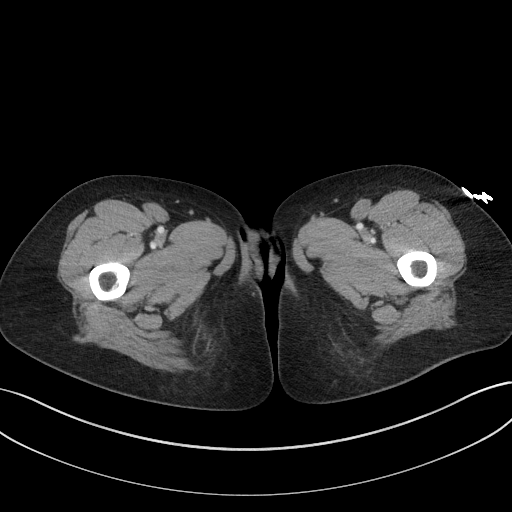
[im 23/95  soft-tissue]
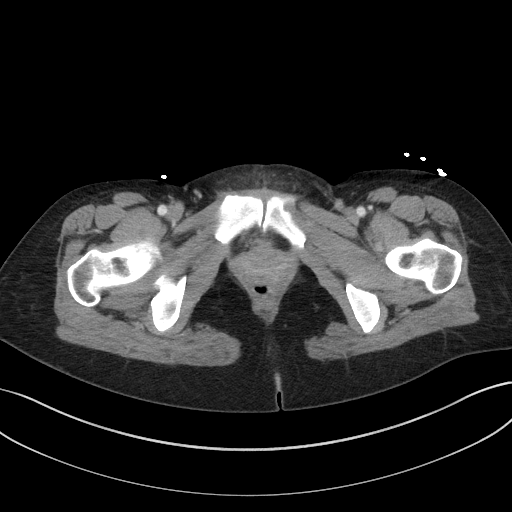
[im 28/95  soft-tissue]
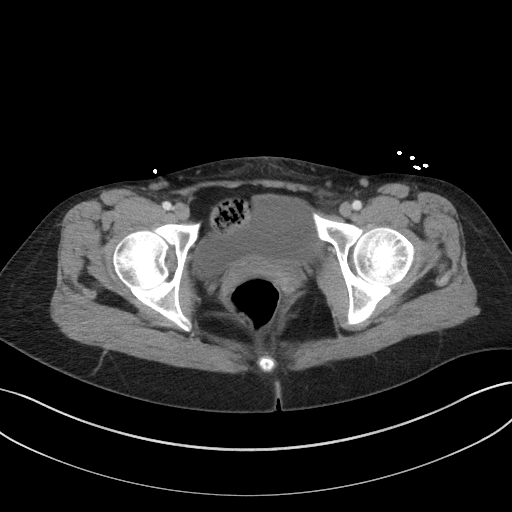
[im 34/95  soft-tissue]
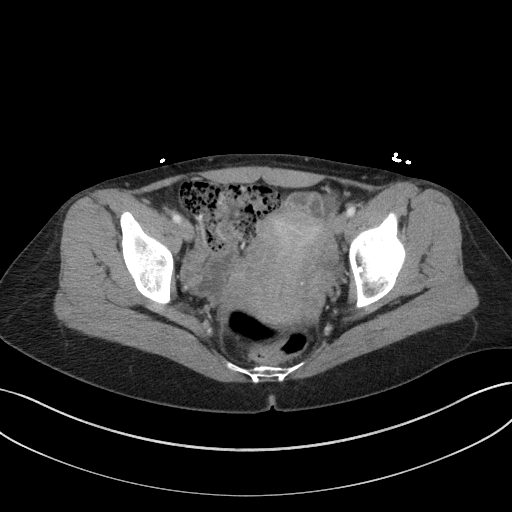
[im 39/95  soft-tissue]
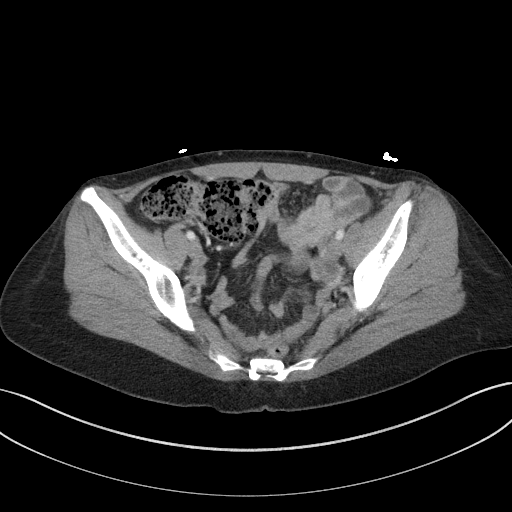
[im 50/95  soft-tissue]
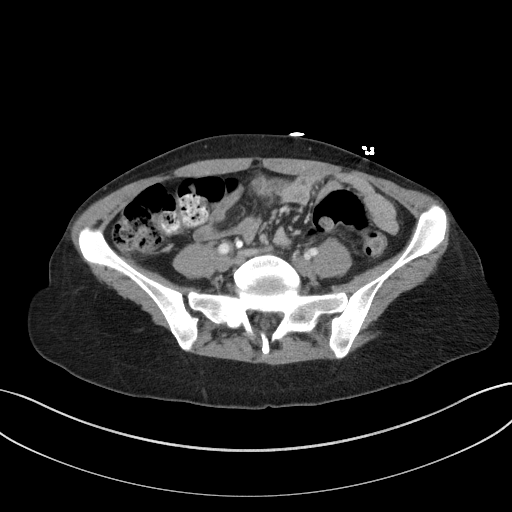
[im 56/95  soft-tissue]
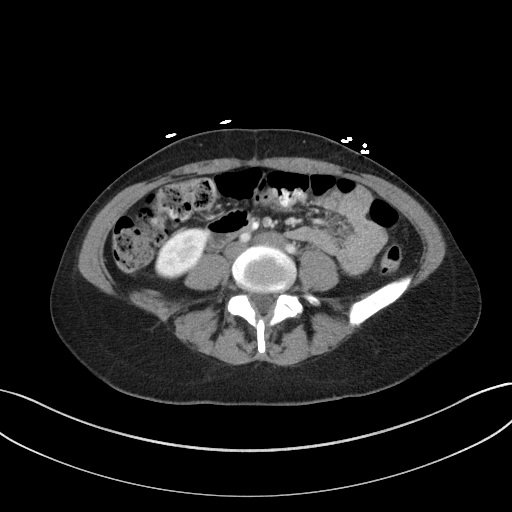
[im 61/95  soft-tissue]
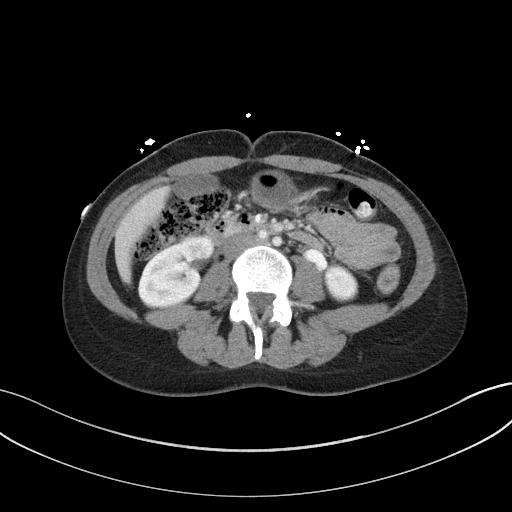
[im 61/95  bone]
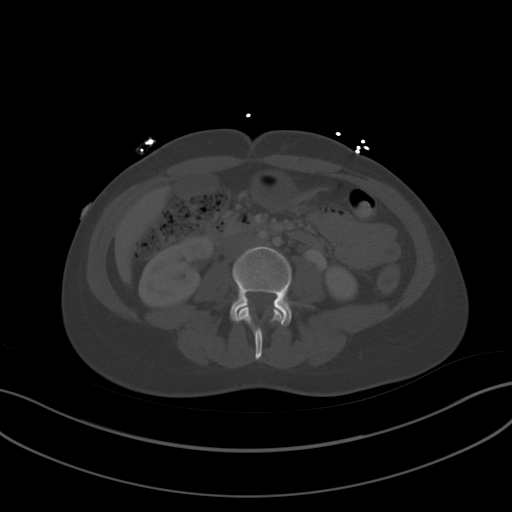
[im 67/95  soft-tissue]
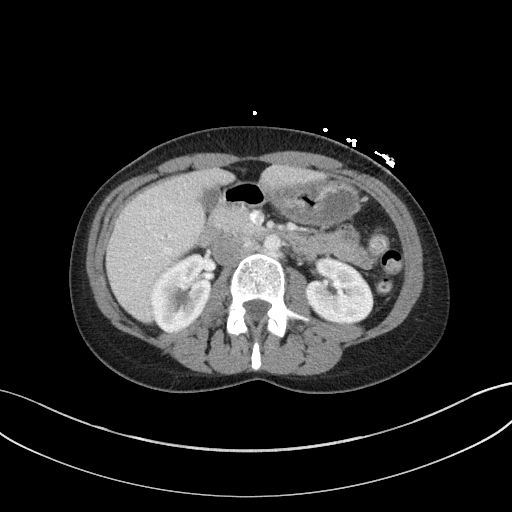
[im 72/95  soft-tissue]
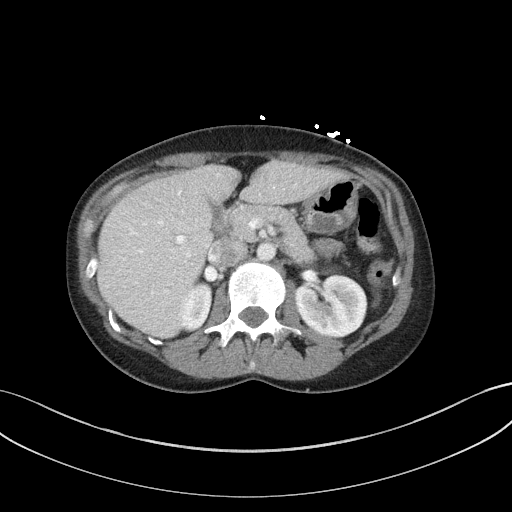
[im 83/95  soft-tissue]
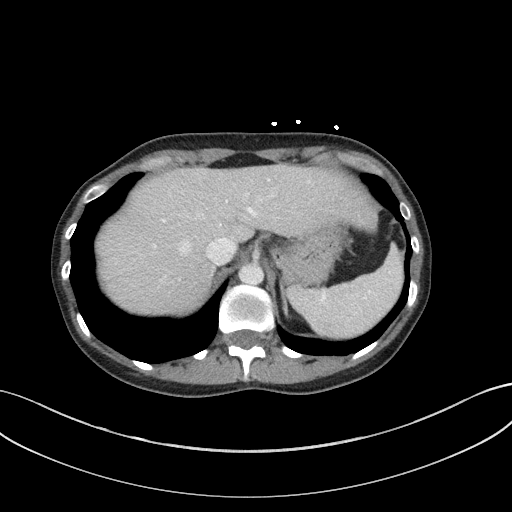
[im 89/95  soft-tissue]
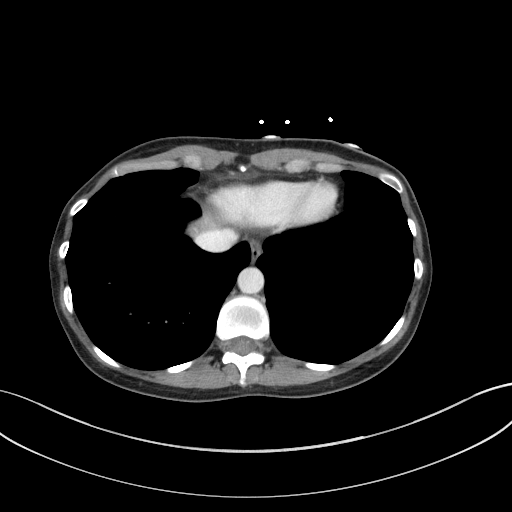

[Series 6: a/p w/ cor · coronal · 0.80mm/px · 3 of 125 slices shown]
[im 42/125  soft-tissue]
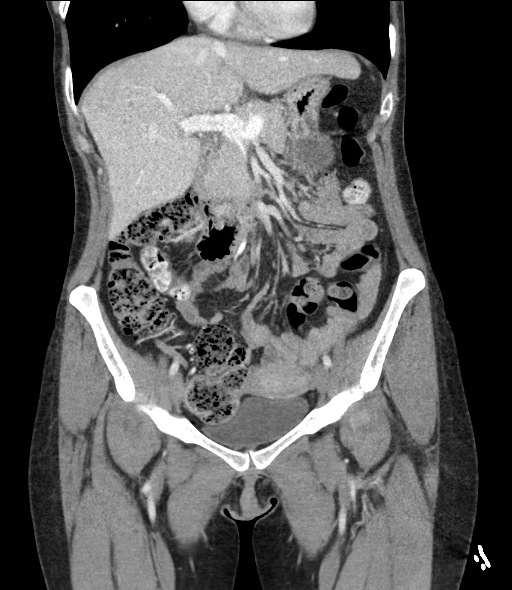
[im 56/125  soft-tissue]
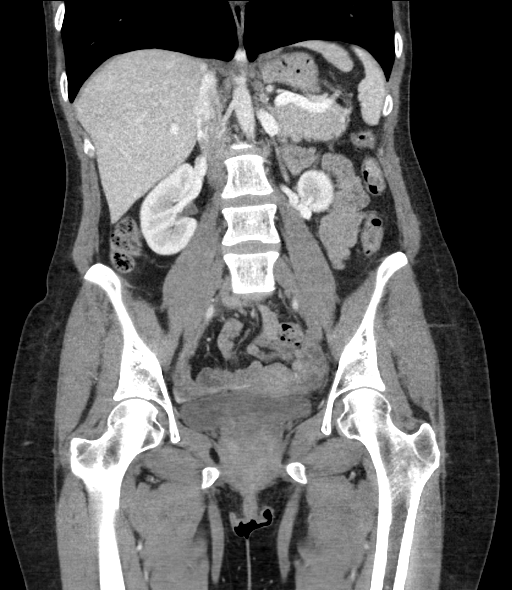
[im 69/125  soft-tissue]
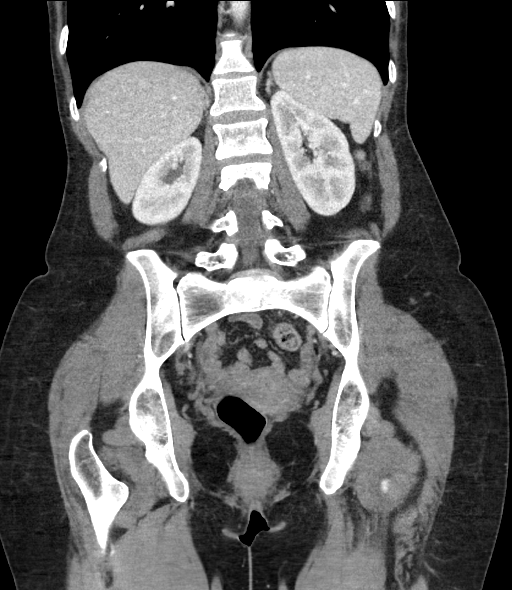

[16 of 46 positions shown; findings below may reference images not displayed]

FINDINGS: Lower chest: No acute abnormality.

Hepatobiliary: Subcentimeter hypodensities are too small to
characterize. Otherwise no focal liver abnormality. No gallstones,
gallbladder wall thickening, or pericholecystic fluid. No biliary
dilatation.

Pancreas: No focal lesion. Normal pancreatic contour. No surrounding
inflammatory changes. No main pancreatic ductal dilatation.

Spleen: Normal in size without focal abnormality.

Adrenals/Urinary Tract:

No adrenal nodule bilaterally.

Bilateral kidneys enhance symmetrically. No hydronephrosis. No
hydroureter.

The urinary bladder is unremarkable.

Stomach/Bowel: Thickened and low-attenuation gastric body/antral
wall. No evidence of bowel wall thickening or dilatation. Appendix
appears normal.

Vascular/Lymphatic: No significant vascular findings are present. No
enlarged abdominal or pelvic lymph nodes.

Reproductive: Uterus and bilateral adnexa are unremarkable.

Other: No intraperitoneal free fluid. No intraperitoneal free gas.
No organized fluid collection.

Musculoskeletal: No acute or significant osseous findings.
IMPRESSION: Thickened and low-attenuation gastric body/antral wall. Finding
could represent malignancy versus gastritis. Recommend direct
visualization for further evaluation.

## 2023-10-20 ENCOUNTER — Ambulatory Visit
Admission: EM | Admit: 2023-10-20 | Discharge: 2023-10-20 | Disposition: A | Discharge status: Home / Self Care | Attending: Nurse Practitioner | Admitting: Nurse Practitioner

## 2023-10-20 DIAGNOSIS — S61411A Laceration without foreign body of right hand, initial encounter: Secondary | ICD-10-CM | POA: Diagnosis not present

## 2023-10-20 NOTE — Discharge Instructions (Addendum)
 The wound of your right hand was repaired with 3 sutures. Keep the dressing in place for 24 hours. Remove the dressing and clean the area with warm soap and water water in 24 hours.  When you are at home, may leave the area open to air.  When you are out, recommend keeping the area covered. May apply Neosporin to the affected area daily with bandage change. May take over-the-counter ibuprofen or Tylenol for pain or discomfort. Apply ice to the affected area to help with pain or swelling.  Apply for 20 minutes, remove for 1 hour, repeat as needed. Follow-up immediately if you develop swelling, increased redness that goes into the hand or up the arm, foul-smelling drainage, or if you develop fever, chills, or other concerns. Keep the sutures in place for 7 days.  You will follow-up on 10/27/23 for suture removal. Follow-up as needed.

## 2023-10-20 NOTE — ED Provider Notes (Signed)
 [facility]    CSN: [id number] Arrival date & time: 10/20/23  0855      History   Chief Complaint No chief complaint on file.   HPI Deanna Brooks is a 43 y.o. female.   The history is provided by the patient.   Patient presents for complaints of a laceration to the palm of the right hand.  Patient states that she fell in her laundry room earlier this morning.  She denies numbness or tingling, but states that she has had intermittent radiation of pain up towards the right wrist.  She denies use of blood thinning medications.  Patient declines tetanus vaccination today.  She has not taken any medication for her symptoms.  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      Home Medications    Prior to Admission medications   Medication Sig Start Date End Date Taking? Authorizing Provider  b complex vitamins capsule Take 1 capsule by mouth daily.    [provider]  busPIRone (BUSPAR) 5 MG tablet Take 1 tablet by mouth as needed. 05/07/20   [provider]  cholecalciferol (VITAMIN D) 25 MCG (1000 UNIT) tablet Take 3 tablets by mouth daily.    [provider]  omeprazole  (PRILOSEC) 40 MG capsule TAKE 1 CAPSULE (40 MG TOTAL) BY MOUTH IN THE MORNING AND AT BEDTIME. 11/03/20   Janel Medford, MD  sucralfate  (CARAFATE ) 1 g tablet Take 1 tablet (1 g total) by mouth 4 (four) times daily -  with meals and at bedtime. 08/28/20   Deatra Face, MD  vitamin C (ASCORBIC ACID) 500 MG tablet Take 1,000 mg by mouth daily.    [provider]    Family History Family History  Problem Relation Age of Onset   Colon polyps Neg Hx    Colon cancer Neg Hx    Esophageal cancer Neg  Hx    Rectal cancer Neg Hx    Stomach cancer Neg Hx     Social History Social History   Tobacco Use   Smoking status: Former   Smokeless tobacco: Never  Substance Use Topics   Alcohol use: Not Currently   Drug use: Never     Allergies   Gluten meal and Wheat   Review of Systems Review of Systems Per HPI  Physical Exam Triage Vital Signs ED Triage Vitals  Encounter Vitals Group     BP 10/20/23 0929 134/81     Systolic BP Percentile --      Diastolic BP Percentile --      Pulse Rate 10/20/23 0929 97     Resp 10/20/23 0929 16     Temp 10/20/23 0929 98.2 F (36.8 C)     Temp Source 10/20/23 0929 Oral     SpO2 10/20/23 0929 99 %     Weight --      Height --      Head Circumference --      Peak Flow --      Pain Score 10/20/23 0930 2     Pain Loc --      Pain Education --      Exclude from [employer]  Chart --    No data found.  Updated Vital Signs BP 134/81 (BP Location: Right Arm)   Pulse 97   Temp 98.2 F (36.8 C) (Oral)   Resp 16   SpO2 99%   Visual Acuity Right Eye Distance:   Left Eye Distance:   Bilateral Distance:    Right Eye Near:   Left Eye Near:    Bilateral Near:     Physical Exam Vitals and nursing note reviewed.  Constitutional:      General: She is not in acute distress.    Appearance: Normal appearance.  HENT:     Head: Normocephalic.  Eyes:     Pupils: Pupils are equal, round, and reactive to light.  Pulmonary:     Effort: Pulmonary effort is normal.  Skin:    General: Skin is warm and dry.     Findings: Laceration present.     Comments: Approximate 1.5 cm laceration noted to the palm of the right hand at the base of the fifth digit.  Edges are clean.  Bleeding is controlled at this time.  Neurological:     General: No focal deficit present.     Mental Status: She is alert and oriented to person, place, and time.  Psychiatric:        Mood and Affect: Mood normal.        Behavior: Behavior normal.      UC Treatments /  Results  Labs (all labs ordered are listed, but only abnormal results are displayed) Labs Reviewed - No data to display  EKG   Radiology No results found.  Procedures Laceration Repair  Date/Time: 10/20/2023 10:07 AM  Performed by: Hardy Lia, NP Authorized by: Hardy Lia, NP   Consent:    Consent obtained:  Verbal   Consent given by:  Patient   Risks discussed:  Infection, need for additional repair, pain and poor wound healing   Alternatives discussed:  No treatment Universal protocol:    Procedure explained and questions answered to patient or proxy's satisfaction: yes     Patient identity confirmed:  Verbally with patient Anesthesia:    Anesthesia method:  Local infiltration   Local anesthetic:  Lidocaine 2% w/o epi (3mL) Laceration details:    Location:  Hand   Hand location:  R palm   Length (cm):  1.5 Pre-procedure details:    Preparation:  Patient was prepped and draped in usual sterile fashion Exploration:    Contaminated: no   Treatment:    Wound cleansed with: Hibiclens and sterile water.   Amount of cleaning:  Standard   Irrigation method:  Tap Skin repair:    Repair method:  Sutures   Suture size:  3-0   Wound skin closure material used: Ethilon.   Number of sutures:  3 Approximation:    Approximation:  Close Repair type:    Repair type:  Simple Post-procedure details:    Dressing:  Antibiotic ointment and non-adherent dressing   Procedure completion:  Tolerated Comments:     Approximate 1.5 cm laceration noted to the palm of the right hand.  Site was cleansed with Hibiclens and sterile water.  Local anesthesia provided with lidocaine 2% without epi, 3 mL used for anesthetization.  Laceration repaired with 3 simple interrupted sutures.  Edges are well-approximated postprocedure.  Antibiotic ointment, nonadherent gauze, and Coban applied post procedure.  Patient tolerated the procedure well.  (including critical care  time)  Medications Ordered in UC Medications - No  data to display  Initial Impression / Assessment and Plan / UC Course  I have reviewed the triage vital signs and the nursing notes.  Pertinent labs & imaging results that were available during my care of the patient were reviewed by me and considered in my medical decision making (see chart for details).  Laceration of the right palm repaired with 3 simple interrupted sutures.  Patient tolerated the procedure well.  Patient declined tetanus vaccination update today.  Supportive care recommendations were provided and discussed with the patient to include over-the-counter analgesics, applying ice to the affected area, wound care instructions, and monitoring for signs of infection.  Patient to have sutures removed in 7 days.  Patient was in agreement with this plan of care and verbalizes understanding.  All questions were answered.  Patient stable for discharge.   Final Clinical Impressions(s) / UC Diagnoses   Final diagnoses:  None   Discharge Instructions   None    ED Prescriptions   None    PDMP not reviewed this encounter.   Hardy Lia, NP 10/20/23 1013

## 2023-10-20 NOTE — ED Triage Notes (Signed)
 Pt reports cut to the palm of the right hand below the pinky finger. Occurred this morning.
